# Patient Record
Sex: Female | Born: 1975
Health system: Southern US, Community
[De-identification: ages and names within clinical notes are randomized; demographics above are authoritative.]

## PROBLEM LIST (undated history)

## (undated) DIAGNOSIS — Z9289 Personal history of other medical treatment: Secondary | ICD-10-CM

## (undated) HISTORY — DX: Personal history of other medical treatment: Z92.89

---

## 2008-05-07 ENCOUNTER — Observation Stay: Payer: Self-pay

## 2008-05-09 ENCOUNTER — Inpatient Hospital Stay: Payer: Self-pay | Admitting: Obstetrics and Gynecology

## 2009-12-09 ENCOUNTER — Inpatient Hospital Stay: Payer: Self-pay | Admitting: Unknown Physician Specialty

## 2013-06-27 ENCOUNTER — Inpatient Hospital Stay: Payer: Self-pay | Admitting: Obstetrics & Gynecology

## 2013-06-27 LAB — CBC WITH DIFFERENTIAL/PLATELET
Basophil #: 0 10*3/uL (ref 0.0–0.1)
Basophil %: 0.3 %
Eosinophil %: 0.4 %
Lymphocyte #: 1.5 10*3/uL (ref 1.0–3.6)
MCH: 31.2 pg (ref 26.0–34.0)
MCHC: 34.7 g/dL (ref 32.0–36.0)
MCV: 90 fL (ref 80–100)
Monocyte #: 0.4 x10 3/mm (ref 0.2–0.9)
Monocyte %: 5 %
Neutrophil #: 7 10*3/uL — ABNORMAL HIGH (ref 1.4–6.5)
RBC: 4.31 10*6/uL (ref 3.80–5.20)
RDW: 13 % (ref 11.5–14.5)
WBC: 9 10*3/uL (ref 3.6–11.0)

## 2013-12-22 DIAGNOSIS — Z9289 Personal history of other medical treatment: Secondary | ICD-10-CM

## 2013-12-22 HISTORY — DX: Personal history of other medical treatment: Z92.89

## 2014-12-14 NOTE — Op Note (Signed)
PATIENT NAME:  Penny Lowery, Penny Lowery MR#:  [id number] DATE OF BIRTH:  02/29/1976  DATE OF PROCEDURE:  06/27/2013  PREOPERATIVE DIAGNOSES:  Prior history of cesarean section, term intrauterine pregnancy, desire for permanent sterility.   POSTOPERATIVE DIAGNOSES:  Prior history of cesarean section, term intrauterine pregnancy, desire for permanent sterility.   PROCEDURE PERFORMED:  Low transverse cesarean section, bilateral tubal ligation.  Placement of On-Q pain pump.   SURGEON: Annamarie MajorPaul Jahzion Brogden, M.D.   ASSISTANT: Anna GenreLindsay Finch, PA, student.   ANESTHESIA: Spinal.   BLOOD LOSS: 250 mL.   COMPLICATIONS: None.   FINDINGS: Normal tubes, ovaries and uterus. Viable female infant weighing 7 pounds 9 ounces. Apgar scores of 8 and 9 at one and five minutes respectively.   DISPOSITION: To the recovery room in stable condition.   TECHNIQUE: The patient is prepped and draped in the usual sterile fashion after adequate anesthesia is obtained in the supine position on the Operating Room table. Scalpel was used to create a low transverse skin incision through the area of the prior scar down the level of the rectus fascia which was dissected bilaterally using Mayo scissors. Rectus muscle was separated within the rectus fascia and separated in the midline. Peritoneum is penetrated. The bladder is inferiorly dissected and retracted. A scalpel was used to create a low transverse hysterotomy incision. The hysterotomy incision then is extended by blunt dissection. Amniotomy reveals clear fluid. The infant's head is grasped and delivered with suctioning of the oropharynx. No nuchal cord is encountered. No vacuum is used.  The remaining portion of the infant is then delivered and handed off to the pediatric team.   Cord blood is obtained and the placenta is manually extracted. The uterus is externalized and cleansed of all membranes and debris using a moist sponge. The hysterotomy incision is closed with a running #1 Vicryl  suture in a locking fashion followed by a second layer to imbricate the first layer with excellent hemostasis noted.   The right and left fallopian tubes were grasped in the midportion with a Babcock clamp and brought out to the fimbria. The midportion of this fallopian tube was then suture ligated, excised and cauterized. Excellent hemostasis is noted at the tubal site and it is sent to pathology for further review.   The uterus is placed back in the intra-abdominal cavity and the paracolic gutters are irrigated with warm saline. Re-examination of the incision reveals excellent hemostasis. The peritoneum is closed. The trocar for the On-Q pain pump is then placed through the abdomen into the subfascial space and the Silver soaker catheters are then threaded into place without difficulty. The rectus fascia is closed with 0 Maxon suture with careful placement of the suture not to incorporate the catheters. Subcutaneous tissues are irrigated and hemostasis is assured using electrocautery. Skin is closed with 4-0 Vicryl suture in a subcuticular fashion followed for placement of Dermabond. The On-Q pain pump catheters are also stabilized with Dermabond, Steri-Strips and a Tegaderm bandage. The patient goes to the Recovery Room in stable condition. All sponge, instrument, and needle counts are correct. The On-Q pain pump was flushed with 5 mL each of bupivacaine and then hooked to pain pump itself.    ____________________________ R. Annamarie MajorPaul Emani Morad, MD rph:cs D: 06/27/2013 18:25:34 ET T: 06/27/2013 19:07:02 ET JOB#: [id number]  cc: Dierdre Searles. Paul Neli Fofana, MD, <Dictator> Nadara MustardOBERT P Hawthorne Day MD ELECTRONICALLY SIGNED 06/28/2013 0:23

## 2015-01-01 NOTE — H&P (Signed)
L&D Evaluation:  History Expanded:  HPI 39 yo G3P2 at 4038 5/[redacted] weeks EGA w prior Cesarean Section, presents w pain and d/c today.  No ROM.  Good FM.  [facility], desires repeat Cesarean Section and Bilateral Tubal Ligation for permanent sterility as noted in chart throughout pregnancy and reiterated today.   O+, RI, VI. GBS Neg.   Gravida 3   Term 2   PreTerm 0   Abortion 0   Living 2   Blood Type (Maternal) O positive   Group B Strep Results Maternal (Result >5wks must be treated as unknown) negative   Maternal Varicella Immune   Rubella Results (Maternal) immune   EDC 06-Jul-2013   Presents with contractions   Patient's Medical History No Chronic Illness   Patient's Surgical History Previous C-Section   Medications Pre Natal Vitamins   Allergies PCN   Social History none   Family History Non-Contributory   ROS:  ROS All systems were reviewed.  HEENT, CNS, GI, GU, Respiratory, CV, Renal and Musculoskeletal systems were found to be normal.   Exam:  Vital Signs stable   General no apparent distress   Mental Status clear   Chest clear   Heart normal sinus rhythm, no murmur/gallop/rubs   Abdomen gravid, tender with contractions   Estimated Fetal Weight Average for gestational age   Fetal Position VTX   Back no CVAT   Edema no edema   Pelvic no external lesions, 2/80/-2   Mebranes Intact   FHT normal rate with no decels   Ucx regular   Skin dry   Impression:  Impression early labor   Plan:  Plan EFM/NST, monitor contractions and for cervical change   Comments Since cervical change here and reg contractions, will proceed w Cesarean Section and Bilateral Tubal Ligation today. Pt has been fully informed of the pros and cons, risk/benefits of repeat Cesarean section versus VBAC.  The risks of a repeat Cesarean section include all the risks of major surgery, such as the risk of anesthesia, hemorrhage, infection,  injury to adjacent organs-bowel, bladder and blood vessels.  The risks of attempting a VBAC include a 1 in 100 risk of uterine rupture with resultant potentially severe fetal/maternal complications and an increased risk of Cesarean complications should she not be successful. After carefully weighing the pros and cons, the patient strongly requests a repeat Cesarean section. All methods of contraception, both temporary and permanent, have been discussed with this patient.  She understands that tubal ligation is meant to be permanent and absolute although there is a one in 300 failure rate. The patient was informed about both the short and long term potential complications of tubal ligation.  She understands the risks of this surgery, including but not limited to, risks of anesthesia, hemorrhage, infection, injury to adjacent structures, bowel, bladder, and blood vessels.   Electronic Signatures: Letitia LibraHarris, Surah Pelley Paul (MD)  (Signed [phone] [id number]-Nov-14 17:07)  Authored: L&D Evaluation   Last Updated: 04-Nov-14 17:07 by Letitia LibraHarris, Abdalla Naramore Paul (MD)

## 2015-06-07 ENCOUNTER — Other Ambulatory Visit: Payer: Self-pay | Admitting: Otolaryngology

## 2015-06-07 DIAGNOSIS — J3489 Other specified disorders of nose and nasal sinuses: Secondary | ICD-10-CM

## 2017-04-14 ENCOUNTER — Encounter: Payer: Self-pay | Admitting: Obstetrics and Gynecology

## 2017-04-14 ENCOUNTER — Other Ambulatory Visit (INDEPENDENT_AMBULATORY_CARE_PROVIDER_SITE_OTHER): Payer: 59

## 2017-04-14 ENCOUNTER — Ambulatory Visit (INDEPENDENT_AMBULATORY_CARE_PROVIDER_SITE_OTHER): Payer: 59 | Admitting: Obstetrics and Gynecology

## 2017-04-14 VITALS — BP 100/74 | HR 88 | Ht 60.0 in | Wt 101.0 lb

## 2017-04-14 DIAGNOSIS — R1032 Left lower quadrant pain: Secondary | ICD-10-CM | POA: Diagnosis not present

## 2017-04-14 DIAGNOSIS — Z1231 Encounter for screening mammogram for malignant neoplasm of breast: Secondary | ICD-10-CM | POA: Diagnosis not present

## 2017-04-14 DIAGNOSIS — Z01419 Encounter for gynecological examination (general) (routine) without abnormal findings: Secondary | ICD-10-CM

## 2017-04-14 DIAGNOSIS — Z1239 Encounter for other screening for malignant neoplasm of breast: Secondary | ICD-10-CM

## 2017-04-14 DIAGNOSIS — Z124 Encounter for screening for malignant neoplasm of cervix: Secondary | ICD-10-CM | POA: Diagnosis not present

## 2017-04-14 DIAGNOSIS — Z1151 Encounter for screening for human papillomavirus (HPV): Secondary | ICD-10-CM

## 2017-04-14 NOTE — Progress Notes (Signed)
Chief Complaint  Patient presents with  . Gynecologic Exam  . Urinary Tract Infection    frequent but no current sx     HPI:      Ms. Penny Lowery is a 41 y.o. [phone] who LMP was Patient's last menstrual period was 04/01/2017., presents today for her annual examination.  Her menses are regular every 28-30 days, lasting 5 days.  Dysmenorrhea none. She does not have intermenstrual bleeding.  Sex activity: single partner, contraception - tubal ligation.  Last Pap: Dec 22, 2013  Results were: no abnormalities /neg HPV DNA  Hx of STDs: none  There is no FH of breast cancer. There is no FH of ovarian cancer. The patient does do self-breast exams.  Tobacco use: The patient denies current or previous tobacco use. Alcohol use: none Exercise: moderately active  She does get adequate calcium and Vitamin D in her diet.  She complains of intermittent LLQ pain over the past few months. No relation to her cycle.  She denies any GI sx, vag sx, fevers. She has occas dysuria but has neg UTI eval when she goes.    Past Medical History:  Diagnosis Date  . History of Papanicolaou smear of cervix 12/22/2013   NIL/NEG    Past Surgical History:  Procedure Laterality Date  . CESAREAN SECTION      Family History  Problem Relation Age of Onset  . Diabetes Mother   . Hypertension Mother   . Stroke Father 69  . Breast cancer Neg Hx   . Ovarian cancer Neg Hx     Social History   Social History  . Marital status: Married    Spouse name: N/A  . Number of children: N/A  . Years of education: N/A   Occupational History  . Not on file.   Social History Main Topics  . Smoking status: Never Smoker  . Smokeless tobacco: Never Used  . Alcohol use Yes     Comment: occasional  . Drug use: No  . Sexual activity: Yes   Other Topics Concern  . Not on file   Social History Narrative  . No narrative on file    No current outpatient prescriptions on file.  ROS:  Review of Systems    Constitutional: Negative for fatigue, fever and unexpected weight change.  Respiratory: Negative for cough, shortness of breath and wheezing.   Cardiovascular: Negative for chest pain, palpitations and leg swelling.  Gastrointestinal: Negative for blood in stool, constipation, diarrhea, nausea and vomiting.  Endocrine: Negative for cold intolerance, heat intolerance and polyuria.  Genitourinary: Positive for dysuria and pelvic pain. Negative for dyspareunia, flank pain, frequency, genital sores, hematuria, menstrual problem, urgency, vaginal bleeding, vaginal discharge and vaginal pain.  Musculoskeletal: Negative for back pain, joint swelling and myalgias.  Skin: Negative for rash.  Neurological: Positive for headaches. Negative for dizziness, syncope, light-headedness and numbness.  Hematological: Negative for adenopathy.  Psychiatric/Behavioral: Negative for agitation, confusion, sleep disturbance and suicidal ideas. The patient is not nervous/anxious.      Objective: BP 100/74   Pulse 88   Ht 5' (1.524 m)   Wt 101 lb (45.8 kg)   LMP 04/01/2017   BMI 19.73 kg/m    Physical Exam  Constitutional: She is oriented to person, place, and time. She appears well-developed and well-nourished.  Genitourinary: Vagina normal and uterus normal. There is no rash or tenderness on the right labia. There is no rash or tenderness on the left labia. No erythema  or tenderness in the vagina. No vaginal discharge found. Right adnexum does not display mass and does not display tenderness.  Left adnexum displays tenderness. Left adnexum does not display mass and does not display fullness. Cervix does not exhibit motion tenderness or polyp. Uterus is not enlarged or tender.  Neck: Normal range of motion. No thyromegaly present.  Cardiovascular: Normal rate, regular rhythm and normal heart sounds.   No murmur heard. Pulmonary/Chest: Effort normal and breath sounds normal. Right breast exhibits no mass, no  nipple discharge, no skin change and no tenderness. Left breast exhibits no mass, no nipple discharge, no skin change and no tenderness.  Abdominal: Soft. There is tenderness in the left lower quadrant. There is no rigidity and no guarding.  Musculoskeletal: Normal range of motion.  Neurological: She is alert and oriented to person, place, and time. No cranial nerve deficit.  Psychiatric: She has a normal mood and affect. Her behavior is normal.  Vitals reviewed.   Results: GYN U/S-->EM=6.77 MM; NO FF IN CDS; BILAT OVAR WNL   Assessment/Plan: Encounter for annual routine gynecological examination  Cervical cancer screening - Plan: IGP, Aptima HPV  Screening for HPV (human papillomavirus) - Plan: IGP, Aptima HPV  Screening for breast cancer - Pt to sched mammo. - Plan: MM DIGITAL SCREENING BILATERAL  LLQ pain - Tender on exam, sx intermittent, neg GYN u/s. Can f/u with PCP if sx persist.  - Plan: US Transvaginal Non-OB            GYN counsel mammography screening, adequate intake of calcium and vitamin D, diet and exercise     F/U  Return in about 1 year (around 04/14/2018).  Opal Dinning B. Nohealani Medinger, PA-C 04/14/2017 4:31 PM

## 2017-04-14 NOTE — Patient Instructions (Signed)
[facility]: [phone]  [facility]: [phone] 

## 2017-04-17 LAB — IGP, APTIMA HPV
HPV APTIMA: NEGATIVE
PAP SMEAR COMMENT: 0

## 2017-09-15 ENCOUNTER — Encounter: Payer: Self-pay | Admitting: Obstetrics and Gynecology

## 2017-09-15 ENCOUNTER — Ambulatory Visit
Admission: RE | Admit: 2017-09-15 | Discharge: 2017-09-15 | Disposition: A | Discharge status: Home / Self Care | Payer: 59 | Source: Ambulatory Visit | Attending: Obstetrics and Gynecology | Admitting: Obstetrics and Gynecology

## 2017-09-15 DIAGNOSIS — Z1231 Encounter for screening mammogram for malignant neoplasm of breast: Secondary | ICD-10-CM | POA: Insufficient documentation

## 2017-09-15 DIAGNOSIS — Z1239 Encounter for other screening for malignant neoplasm of breast: Secondary | ICD-10-CM

## 2018-09-27 ENCOUNTER — Ambulatory Visit (INDEPENDENT_AMBULATORY_CARE_PROVIDER_SITE_OTHER): Payer: 59 | Admitting: Urology

## 2018-09-27 ENCOUNTER — Encounter: Payer: Self-pay | Admitting: Urology

## 2018-09-27 VITALS — BP 137/81 | HR 75 | Ht 60.0 in | Wt 109.7 lb

## 2018-09-27 DIAGNOSIS — R1012 Left upper quadrant pain: Secondary | ICD-10-CM

## 2018-09-27 DIAGNOSIS — N200 Calculus of kidney: Secondary | ICD-10-CM

## 2018-09-27 DIAGNOSIS — N390 Urinary tract infection, site not specified: Secondary | ICD-10-CM

## 2018-09-27 LAB — MICROSCOPIC EXAMINATION
Bacteria, UA: NONE SEEN
RBC, UA: NONE SEEN /hpf (ref 0–2)
WBC, UA: NONE SEEN /hpf (ref 0–5)

## 2018-09-27 LAB — URINALYSIS, COMPLETE
Bilirubin, UA: NEGATIVE
Glucose, UA: NEGATIVE
Ketones, UA: NEGATIVE
Leukocytes, UA: NEGATIVE
NITRITE UA: NEGATIVE
Protein, UA: NEGATIVE
Specific Gravity, UA: 1.015 (ref 1.005–1.030)
UUROB: 0.2 mg/dL (ref 0.2–1.0)
pH, UA: 7 (ref 5.0–7.5)

## 2018-09-27 NOTE — Progress Notes (Signed)
09/27/2018 12:16 PM   Artemio Aly 1976/05/28 [id number]  Referring provider: Dione Housekeeper, MD [address] [place], [place] [address]  CC: Recurrent UTI  HPI: I saw Ms. almost in urology clinic in consultation for recurrent UTI from Dr. Zada Finders.  She is a healthy 43 year old Spanish-speaking female who reports 3-4 UTIs per year.  This is been going on over 2 to 3 years.  When she has a UTI she reports urinary frequency, dysuria, and pelvic pain.  There are no prior positive urine cultures in the chart that I am able to review.  She reports that her symptoms improved with antibiotics.  She has a history of a single spontaneously passed kidney stone approximately 15 years ago, and has never required any urologic surgery.  In between urinary tract infection she denies any symptoms of hematuria, dysuria, frequency, urgency, or leakage.  There are no aggravating or alleviating factors.  Severity is moderate.  She also has intermittent left-sided low back pain over the last 5 months.  There is no imaging to review.   PMH: Past Medical History:  Diagnosis Date  . History of Papanicolaou smear of cervix 12/22/2013   NIL/NEG    Surgical History: Past Surgical History:  Procedure Laterality Date  . CESAREAN SECTION      Allergies:  Allergies  Allergen Reactions  . Penicillin G Rash    Family History: Family History  Problem Relation Age of Onset  . Diabetes Mother   . Hypertension Mother   . Stroke Father 56  . Breast cancer Neg Hx   . Ovarian cancer Neg Hx     Social History:  reports that she has never smoked. She has never used smokeless tobacco. She reports current alcohol use. She reports that she does not use drugs.  ROS: Please see flowsheet from today's date for complete review of systems.  Physical Exam: BP 137/81 (BP Location: Left Arm, Patient Position: Sitting, Cuff Size: Normal)   Pulse 75   Ht 5' (1.524 m)   Wt 109 lb 11.2 oz (49.8 kg)   BMI 21.42  kg/m    Constitutional:  Alert and oriented, No acute distress. Cardiovascular: No clubbing, cyanosis, or edema. Respiratory: Normal respiratory effort, no increased work of breathing. GI: Abdomen is soft, nontender, nondistended, no abdominal masses GU: No CVA tenderness Lymph: No cervical or inguinal lymphadenopathy. Skin: No rashes, bruises or suspicious lesions. Neurologic: Grossly intact, no focal deficits, moving all 4 extremities. Psychiatric: Normal mood and affect.  Laboratory Data: Urinalysis today 0 WBCs, 0 RBCs, no bacteria, nitrite negative  Pertinent Imaging: None to review  Assessment & Plan:   In summary the patient is a healthy 43 year old female with reported history of recurrent urinary tract infections and distant history of spontaneously passed kidney stone.  She has chronic left-sided low flank pain for around 5 months that is not classic for stone pain.  Urinalysis is negative today.  We discussed the evaluation and treatment of patients with recurrent UTIs at length.  We specifically discussed the differences between asymptomatic bacteriuria and true urinary tract infection.  We discussed the AUA definition of recurrent UTI of at least 2 culture proven symptomatic acute cystitis episodes in a 49-month period, or 3 within a 1 year period.  We discussed the importance of culture directed antibiotic treatment, and antibiotic stewardship.  First-line therapy includes nitrofurantoin(5 days), Bactrim(3 days), or fosfomycin(3 g single dose).  Possible etiologies of recurrent infection include periurethral tissue atrophy in postmenopausal  woman, constipation, sexual activity, incomplete emptying, anatomic abnormalities, and even genetic predisposition.  Finally, we discussed the role of perineal hygiene, timed voiding, adequate hydration, topical vaginal estrogen, cranberry prophylaxis, and low-dose antibiotic prophylaxis.  Cranberry prophylaxis for UTIs CT stone protocol to  evaluate for nephrolithiasis RTC 6 months for symptom check  Sondra Come, MD  [facility] [address] [place], [place] [address] [phone]

## 2018-09-27 NOTE — Patient Instructions (Signed)
Infeccin urinaria en los adultos  Urinary Tract Infection, Adult    Una infeccin urinaria (IU) puede ocurrir en cualquier lugar de las vas urinarias. Las vas urinarias incluyen a los riones, los urteres, la vejiga y la uretra. Estos rganos fabrican, almacenan y eliminan la orina del organismo.  El mdico puede usar otras palabras para describir la infeccin. La IU alta afecta los urteres y a los riones (pielonefritis). La IU baja afecta la vejiga (cistitis) y la uretra (uretritis).  Cules son las causas?  La mayora de las infecciones de las vas urinarias es causada por la presencia de bacterias en la zona genital, alrededor de la entrada de las vas urinarias (uretra). Estas bacterias proliferan y causan inflamacin en las vas urinarias.  Qu incrementa el riesgo?  Es ms probable que contraiga esta afeccin si:   Tiene colocado un catter urinario (sonda urinaria) permanente.   No puede controlar cundo orinar o defecar (tiene incontinencia).   Es mujer y usted:  ? Utiliza espermicida o diafragma como mtodo anticonceptivo.  ? Tiene niveles bajos de estrgenos.  ? Est embarazada.   Tiene ciertos genes que aumentan su riesgo (gentica).   Es sexualmente activa.   Toma antibiticos.   Tiene una afeccin que causa que el flujo de orina sea lento, como:  ? Prstata agrandada, si usted es hombre.  ? Obstruccin de la uretra (estenosis).  ? Clculo renal.  ? Una afeccin nerviosa que afecta el control de la vejiga (vejiga neurgena).  ? No bebe lo suficiente o no orina con frecuencia.   Tiene ciertas enfermedades crnicas, como:  ? Diabetes.  ? Un sistema que combate las enfermedades (sistemainmunitario) debilitado.  ? Anemia drepanoctica.  ? Gota.  ? Lesin en la mdula espinal.  Cules son los signos o los sntomas?  Los sntomas de esta afeccin incluyen:   Necesidad inmediata (urgente) de orinar.   Miccin frecuente o eliminacin de pequeas cantidades de orina con frecuencia.   Ardor o  dolor al orinar.   Presencia de sangre en la orina.   Orina con mal olor u olor atpico.   Dificultad para orinar.   Orina turbia.   Secrecin vaginal, si es mujer.   Dolor en el abdomen o en la parte inferior de la espalda.  Es posible que tambin tenga:   Vmitos o disminucin del apetito.   Confusin.   Irritabilidad o cansancio.   Fiebre.   Diarrea.  El primer sntoma en los adultos mayores puede ser la confusin. En algunos casos, es posible que no tengan sntomas hasta que la infeccin empeore.  Cmo se diagnostica?  Esta afeccin se diagnostica en funcin de sus antecedentes mdicos y de un examen fsico. Tambin pueden hacerle otras pruebas, incluidas las siguientes:   Anlisis de orina.   Anlisis de sangre.   Pruebas de infecciones de transmisin sexual (ITS).  Si ha tenido ms de una infeccin urinaria (IU), se pueden hacer estudios de diagnstico por imgenes o una cistoscopia para determinar la causa de las infecciones.  Cmo se trata?  El tratamiento de esta afeccin incluye lo siguiente:   Antibiticos.   Medicamentos de venta libre para aliviar las molestias.   Beber una cantidad suficiente agua para mantenerse hidratado.  Si tiene infecciones con frecuencia o tiene otras afecciones, como un clculo renal, es posible que deba ver a un mdico especialista en las vas urinarias (urlogo).  En casos poco frecuentes, las infecciones urinarias pueden provocar sepsis. La sepsis es una afeccin potencialmente   mortal que se produce cuando el cuerpo responde a una infeccin. La sepsis se trata en el hospital con antibiticos, lquidos y otros medicamentos que se administran por va intravenosa.  Siga estas indicaciones en su casa:    Medicamentos   Tome los medicamentos de venta libre y los recetados solamente como se lo haya indicado el mdico.   Si le recetaron un antibitico, tmelo como se lo haya indicado el mdico. No deje de usar el antibitico aunque comience a sentirse  mejor.  Indicaciones generales   Asegrese de hacer lo siguiente:  ? Vaciar la vejiga con frecuencia y en su totalidad. No contener la orina durante largos perodos.  ? Vaciar la vejiga despus de tener sexo.  ? Limpiarse de adelante hacia atrs despus de defecar, si es mujer. Use cada trozo de papel higinico solo una vez cuando se limpie.   Beba suficiente lquido como para mantener la orina de color amarillo plido.   Concurra a todas las visitas de control como se lo haya indicado el mdico. Esto es importante.  Comunquese con un mdico si:   Los sntomas no mejoran despus de 1 o 2das de tratamiento.   Los sntomas desaparecen y luego vuelven a aparecer.  Solicite ayuda inmediatamente si tiene:   Dolor intenso en la espalda o en la parte inferior del abdomen.   Fiebre.   Nuseas o vmitos.  Resumen   Una infeccin urinaria (IU) es una infeccin en cualquier parte de las vas urinarias, que incluyen los riones, los urteres, la vejiga y la uretra.   La mayora de las infecciones de las vas urinarias es causada por la presencia de bacterias en la zona genital, alrededor de la entrada de las vas urinarias (uretra).   El tratamiento de esta afeccin suele incluir antibiticos.   Si le recetaron un antibitico, tmelo como se lo haya indicado el mdico. No deje de usar el antibitico aunque comience a sentirse mejor.   Concurra a todas las visitas de control como se lo haya indicado el mdico. Esto es importante.  Esta informacin no tiene como fin reemplazar el consejo del mdico. Asegrese de hacerle al mdico cualquier pregunta que tenga.  Document Released: 05/20/2005 Document Revised: 04/13/2018 Document Reviewed: 04/13/2018  Elsevier Interactive Patient Education  2019 [employer]

## 2018-10-07 ENCOUNTER — Ambulatory Visit
Admission: RE | Admit: 2018-10-07 | Discharge: 2018-10-07 | Disposition: A | Discharge status: Home / Self Care | Payer: 59 | Source: Ambulatory Visit | Attending: Urology | Admitting: Urology

## 2018-10-07 DIAGNOSIS — R1012 Left upper quadrant pain: Secondary | ICD-10-CM

## 2019-01-14 IMAGING — MG MM DIGITAL SCREENING BILAT W/ CAD
5 series · 5 of 5 positions shown · non-contrast
Comparison: None.

CLINICAL DATA: Screening.

EXAM:
DIGITAL SCREENING BILATERAL MAMMOGRAM WITH CAD

[R CC]
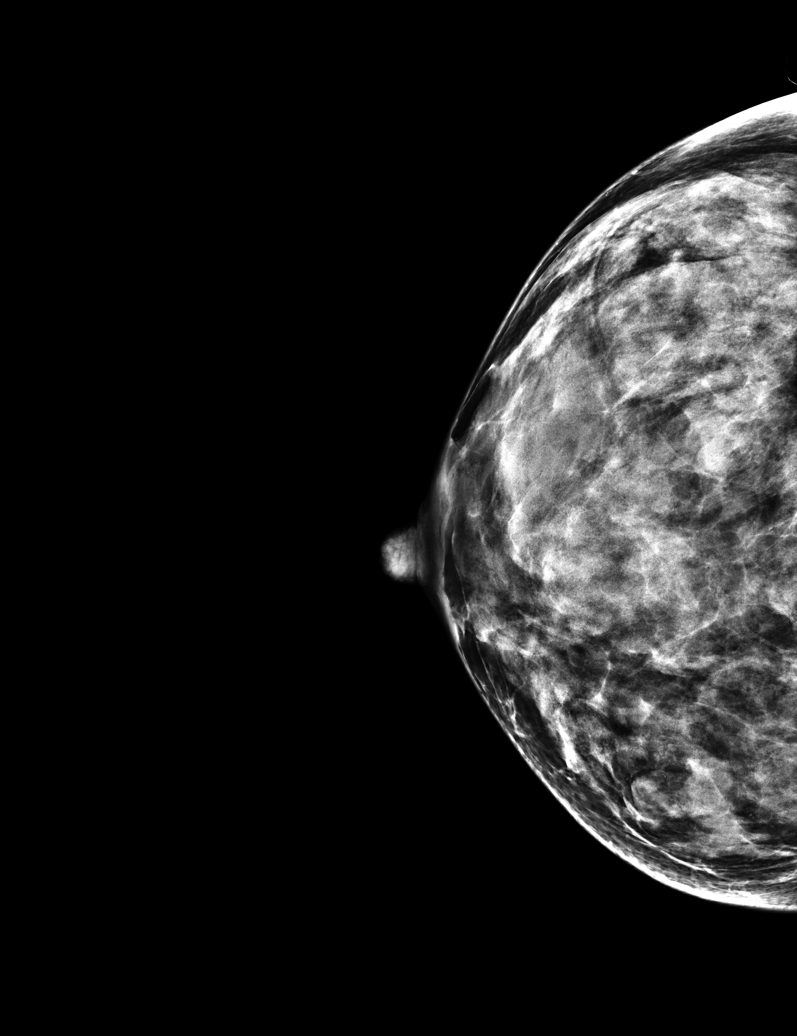

[R MLO (1 of 2)]
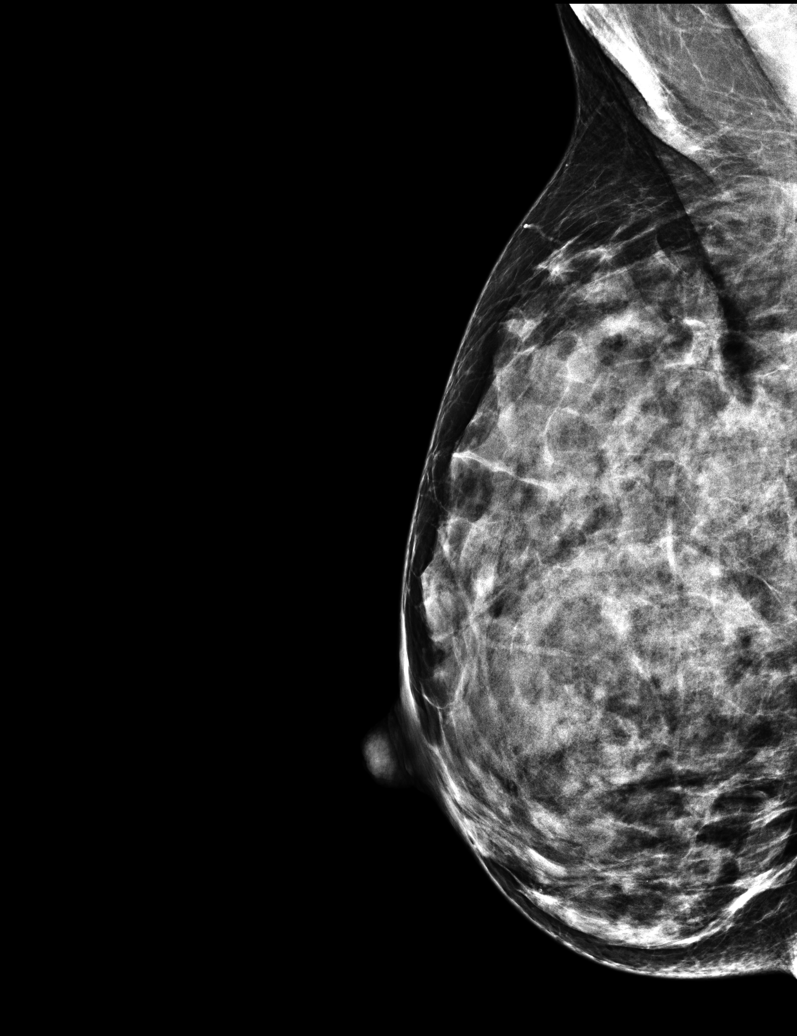

[R MLO (2 of 2)]
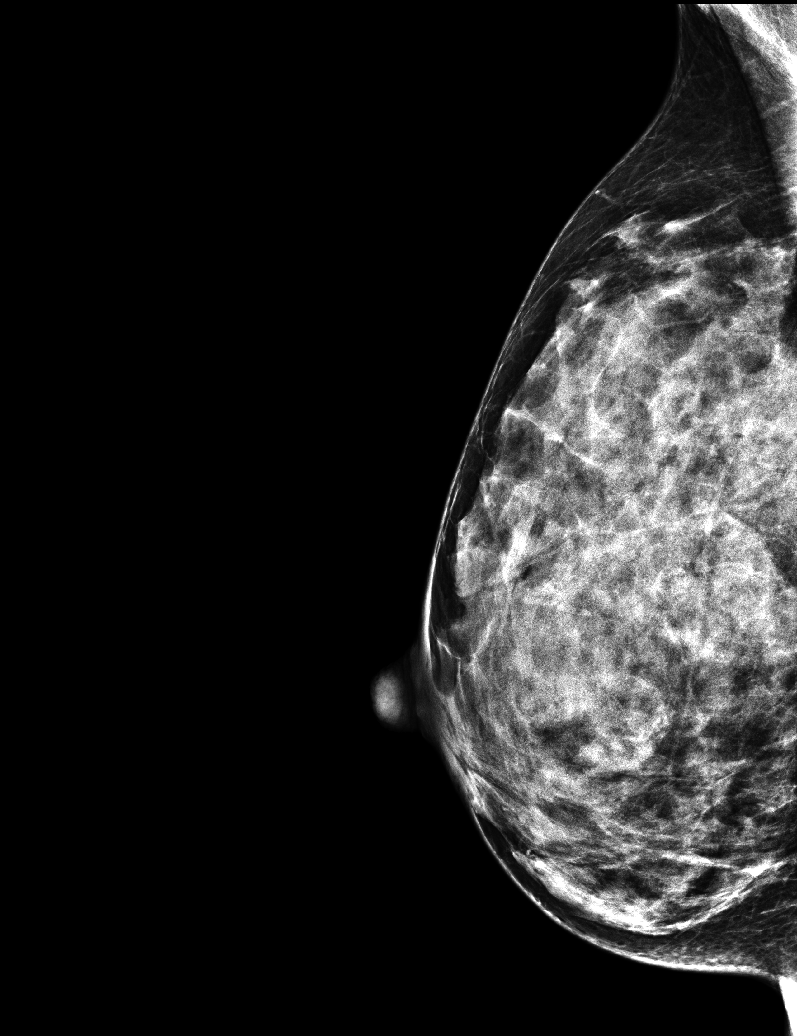

[L MLO]
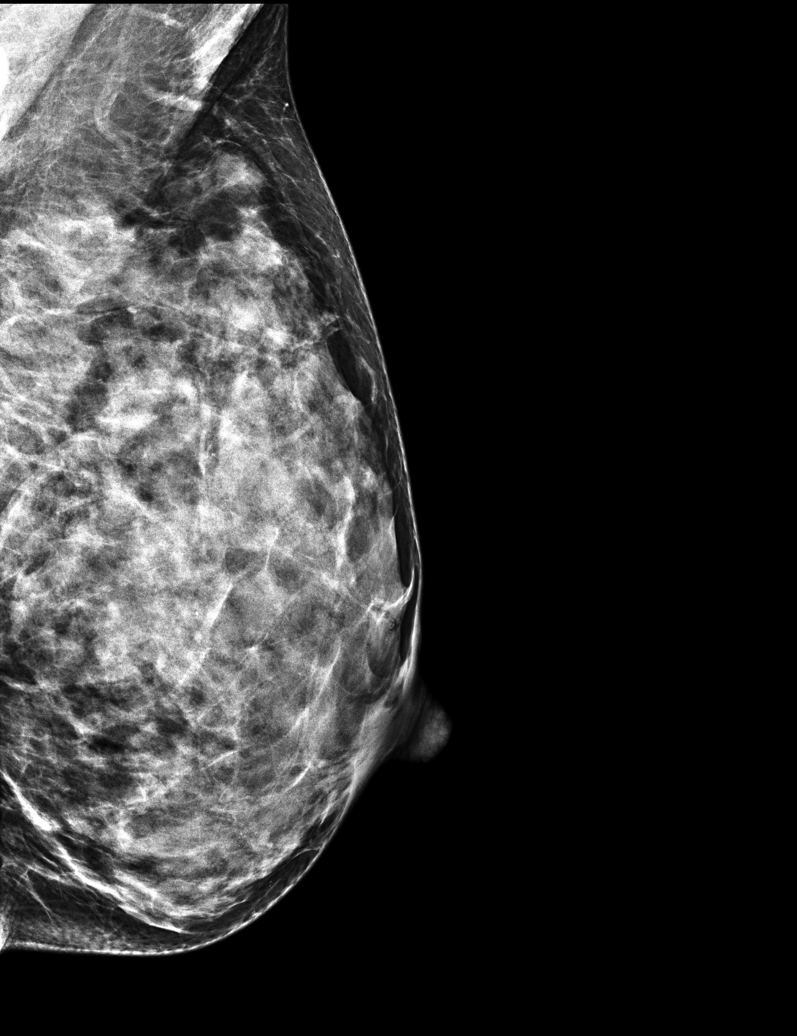

[L CC]
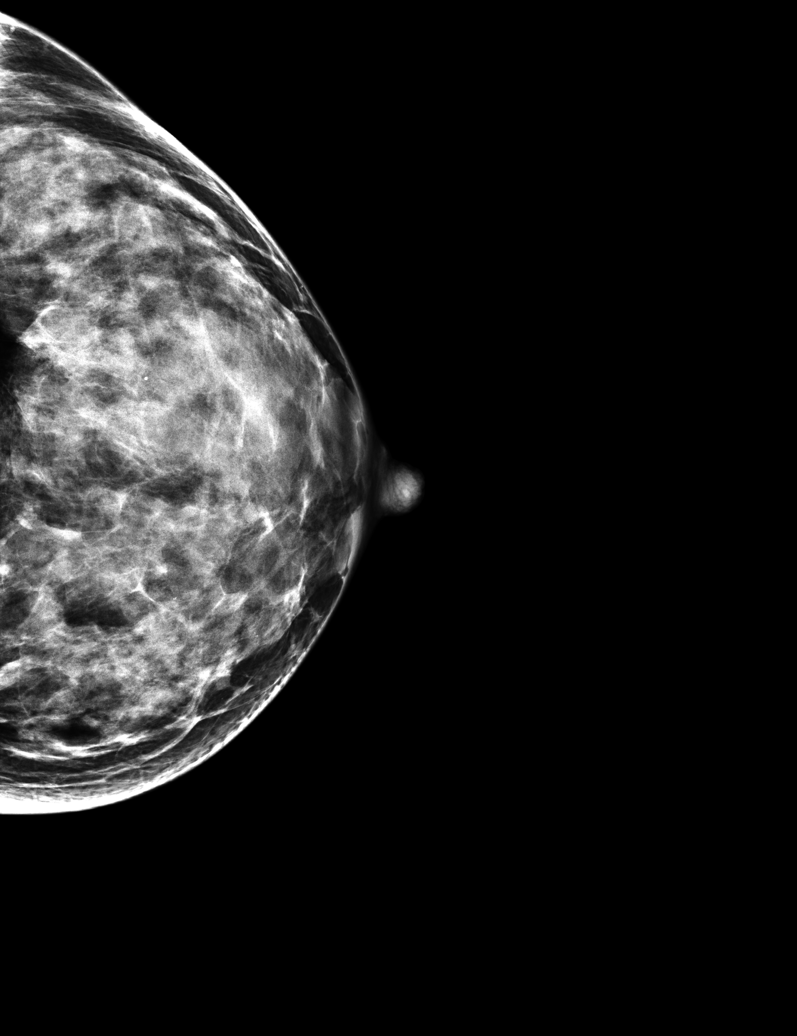

[5 of 5 positions shown; findings below may reference images not displayed]

ACR Breast Density Category d: The breast tissue is extremely dense,
which lowers the sensitivity of mammography.
FINDINGS: There are no findings suspicious for malignancy. Images were
processed with CAD.
IMPRESSION: No mammographic evidence of malignancy. A result letter of this
screening mammogram will be mailed directly to the patient.

RECOMMENDATION:
Screening mammogram in one year. (Code:8A-L-C49)

BI-RADS CATEGORY  1: Negative.

## 2019-03-09 ENCOUNTER — Other Ambulatory Visit: Payer: Self-pay | Admitting: Urology

## 2019-03-09 DIAGNOSIS — N200 Calculus of kidney: Secondary | ICD-10-CM

## 2019-03-09 NOTE — Progress Notes (Signed)
kub

## 2019-03-28 ENCOUNTER — Ambulatory Visit: Payer: 59 | Admitting: Urology

## 2019-04-06 ENCOUNTER — Ambulatory Visit: Payer: 59 | Admitting: Urology

## 2019-11-06 DIAGNOSIS — Z23 Encounter for immunization: Secondary | ICD-10-CM | POA: Diagnosis not present

## 2019-12-11 DIAGNOSIS — Z23 Encounter for immunization: Secondary | ICD-10-CM | POA: Diagnosis not present

## 2020-02-08 ENCOUNTER — Encounter: Payer: Self-pay | Admitting: Obstetrics and Gynecology

## 2020-02-08 ENCOUNTER — Other Ambulatory Visit: Payer: Self-pay

## 2020-02-08 ENCOUNTER — Ambulatory Visit (INDEPENDENT_AMBULATORY_CARE_PROVIDER_SITE_OTHER): Payer: BC Managed Care – PPO | Admitting: Obstetrics and Gynecology

## 2020-02-08 VITALS — BP 104/80 | Ht 63.0 in | Wt 106.0 lb

## 2020-02-08 DIAGNOSIS — Z1231 Encounter for screening mammogram for malignant neoplasm of breast: Secondary | ICD-10-CM

## 2020-02-08 DIAGNOSIS — Z01419 Encounter for gynecological examination (general) (routine) without abnormal findings: Secondary | ICD-10-CM | POA: Diagnosis not present

## 2020-02-08 NOTE — Progress Notes (Signed)
Chief Complaint  Patient presents with  . Gynecologic Exam     HPI:      Ms. Penny Lowery is a 44 y.o. [phone] who LMP was Patient's last menstrual period was 02/03/2020 (exact date)., presents today for her annual examination.  Her menses are regular every 28-30 days, lasting 5 days.  Dysmenorrhea none. She does not have intermenstrual bleeding.  Sex activity: single partner, contraception - tubal ligation.  Last Pap: 04/14/17  Results were: no abnormalities /neg HPV DNA  Hx of STDs: none  Last mammo: 09/15/17 Results: normal, repeat in 12 months. There is no FH of breast cancer. There is no FH of ovarian cancer. The patient does not do self-breast exams.  Tobacco use: The patient denies current or previous tobacco use. Alcohol use: none  No drug use Exercise: moderately active  She does get adequate calcium but not Vitamin D in her diet.  LLQ pain from 2018 resolved.    Past Medical History:  Diagnosis Date  . History of Papanicolaou smear of cervix 12/22/2013   NIL/NEG    Past Surgical History:  Procedure Laterality Date  . CESAREAN SECTION      Family History  Problem Relation Age of Onset  . Diabetes Mother   . Hypertension Mother   . Stroke Father 61  . Lung cancer Maternal Uncle   . Breast cancer Neg Hx   . Ovarian cancer Neg Hx     Social History   Socioeconomic History  . Marital status: Married    Spouse name: Not on file  . Number of children: Not on file  . Years of education: Not on file  . Highest education level: Not on file  Occupational History  . Not on file  Tobacco Use  . Smoking status: Never Smoker  . Smokeless tobacco: Never Used  Vaping Use  . Vaping Use: Never used  Substance and Sexual Activity  . Alcohol use: Yes    Comment: occasional  . Drug use: No  . Sexual activity: Yes    Birth control/protection: Surgical    Comment: Tubal Ligation  Other Topics Concern  . Not on file  Social History Narrative  . Not on file    Social Determinants of Health   Financial Resource Strain:   . Difficulty of Paying Living Expenses:   Food Insecurity:   . Worried About Programme researcher, broadcasting/film/video in the Last Year:   . Barista in the Last Year:   Transportation Needs:   . Freight forwarder (Medical):   Marland Kitchen Lack of Transportation (Non-Medical):   Physical Activity:   . Days of Exercise per Week:   . Minutes of Exercise per Session:   Stress:   . Feeling of Stress :   Social Connections:   . Frequency of Communication with Friends and Family:   . Frequency of Social Gatherings with Friends and Family:   . Attends Religious Services:   . Active Member of Clubs or Organizations:   . Attends Banker Meetings:   Marland Kitchen Marital Status:   Intimate Partner Violence:   . Fear of Current or Ex-Partner:   . Emotionally Abused:   Marland Kitchen Physically Abused:   . Sexually Abused:     No current outpatient medications on file.  ROS:  Review of Systems  Constitutional: Negative for fatigue, fever and unexpected weight change.  Respiratory: Negative for cough, shortness of breath and wheezing.   Cardiovascular: Negative for chest  pain, palpitations and leg swelling.  Gastrointestinal: Negative for blood in stool, constipation, diarrhea, nausea and vomiting.  Endocrine: Negative for cold intolerance, heat intolerance and polyuria.  Genitourinary: Negative for dyspareunia, dysuria, flank pain, frequency, genital sores, hematuria, menstrual problem, pelvic pain, urgency, vaginal bleeding, vaginal discharge and vaginal pain.  Musculoskeletal: Negative for back pain, joint swelling and myalgias.  Skin: Negative for rash.  Neurological: Negative for dizziness, syncope, light-headedness, numbness and headaches.  Hematological: Negative for adenopathy.  Psychiatric/Behavioral: Negative for agitation, confusion, sleep disturbance and suicidal ideas. The patient is not nervous/anxious.      Objective: BP 104/80   Ht  5\' 3"  (1.6 m)   Wt 106 lb (48.1 kg)   LMP 02/03/2020 (Exact Date)   BMI 18.78 kg/m    Physical Exam Constitutional:      Appearance: She is well-developed.  Genitourinary:     Vagina and uterus normal.     No vaginal discharge, erythema or tenderness.     No cervical motion tenderness or polyp.     Uterus is not enlarged or tender.     No right or left adnexal mass present.     Right adnexa not tender.     Left adnexa tender.     Left adnexa not full.  Neck:     Thyroid: No thyromegaly.  Cardiovascular:     Rate and Rhythm: Normal rate and regular rhythm.     Heart sounds: Normal heart sounds. No murmur heard.   Pulmonary:     Effort: Pulmonary effort is normal.     Breath sounds: Normal breath sounds.  Chest:     Breasts:        Right: No mass, nipple discharge, skin change or tenderness.        Left: No mass, nipple discharge, skin change or tenderness.  Abdominal:     Palpations: Abdomen is soft. Abdomen is not rigid.     Tenderness: There is no abdominal tenderness. There is no guarding.  Musculoskeletal:        General: Normal range of motion.     Cervical back: Normal range of motion.  Neurological:     Mental Status: She is alert and oriented to person, place, and time.     Cranial Nerves: No cranial nerve deficit.  Psychiatric:        Behavior: Behavior normal.  Vitals reviewed.    Assessment/Plan: Encounter for annual routine gynecological examination  Encounter for screening mammogram for malignant neoplasm of breast - Plan: MM 3D SCREEN BREAST BILATERAL; pt to sched mammo            GYN counsel mammography screening, adequate intake of calcium and vitamin D, diet and exercise     F/U  Return in about 1 year (around 02/07/2021).  Giah Fickett B. Nataley Bahri, PA-C 02/08/2020 10:38 AM

## 2020-02-08 NOTE — Patient Instructions (Signed)
I value your feedback and entrusting us with your care. If you get a [facility] patient survey, I would appreciate you taking the time to let us know about your experience today. Thank you! ° °As of August 03, 2019, your lab results will be released to your MyChart immediately, before I even have a chance to see them. Please give me time to review them and contact you if there are any abnormalities. Thank you for your patience.  ° °[facility]: [phone] ° ° ° °

## 2020-03-26 ENCOUNTER — Other Ambulatory Visit: Payer: Self-pay

## 2020-03-26 ENCOUNTER — Ambulatory Visit
Admission: RE | Admit: 2020-03-26 | Discharge: 2020-03-26 | Disposition: A | Discharge status: Home / Self Care | Payer: BC Managed Care – PPO | Source: Ambulatory Visit | Attending: Obstetrics and Gynecology | Admitting: Obstetrics and Gynecology

## 2020-03-26 DIAGNOSIS — Z1231 Encounter for screening mammogram for malignant neoplasm of breast: Secondary | ICD-10-CM | POA: Insufficient documentation

## 2020-03-27 ENCOUNTER — Other Ambulatory Visit: Payer: Self-pay | Admitting: Obstetrics and Gynecology

## 2020-03-27 DIAGNOSIS — R928 Other abnormal and inconclusive findings on diagnostic imaging of breast: Secondary | ICD-10-CM

## 2020-03-27 DIAGNOSIS — R921 Mammographic calcification found on diagnostic imaging of breast: Secondary | ICD-10-CM

## 2020-04-09 ENCOUNTER — Encounter: Payer: Self-pay | Admitting: Obstetrics and Gynecology

## 2020-04-09 ENCOUNTER — Other Ambulatory Visit: Payer: Self-pay

## 2020-04-09 ENCOUNTER — Ambulatory Visit
Admission: RE | Admit: 2020-04-09 | Discharge: 2020-04-09 | Disposition: A | Discharge status: Home / Self Care | Payer: BC Managed Care – PPO | Source: Ambulatory Visit | Attending: Obstetrics and Gynecology | Admitting: Obstetrics and Gynecology

## 2020-04-09 DIAGNOSIS — R928 Other abnormal and inconclusive findings on diagnostic imaging of breast: Secondary | ICD-10-CM | POA: Insufficient documentation

## 2020-04-09 DIAGNOSIS — R921 Mammographic calcification found on diagnostic imaging of breast: Secondary | ICD-10-CM | POA: Diagnosis not present

## 2020-09-07 ENCOUNTER — Other Ambulatory Visit: Payer: Self-pay

## 2020-09-07 DIAGNOSIS — Z20822 Contact with and (suspected) exposure to covid-19: Secondary | ICD-10-CM

## 2020-09-11 LAB — NOVEL CORONAVIRUS, NAA: SARS-CoV-2, NAA: DETECTED — AB

## 2020-09-16 ENCOUNTER — Other Ambulatory Visit: Payer: Self-pay | Admitting: Obstetrics and Gynecology

## 2020-09-16 DIAGNOSIS — R921 Mammographic calcification found on diagnostic imaging of breast: Secondary | ICD-10-CM

## 2020-09-27 DIAGNOSIS — J301 Allergic rhinitis due to pollen: Secondary | ICD-10-CM | POA: Diagnosis not present

## 2020-09-27 DIAGNOSIS — J329 Chronic sinusitis, unspecified: Secondary | ICD-10-CM | POA: Diagnosis not present

## 2020-09-27 DIAGNOSIS — J342 Deviated nasal septum: Secondary | ICD-10-CM | POA: Diagnosis not present

## 2020-10-11 ENCOUNTER — Other Ambulatory Visit: Payer: Self-pay

## 2020-10-11 ENCOUNTER — Other Ambulatory Visit: Payer: Self-pay | Admitting: Obstetrics and Gynecology

## 2020-10-11 ENCOUNTER — Ambulatory Visit
Admission: RE | Admit: 2020-10-11 | Discharge: 2020-10-11 | Disposition: A | Discharge status: Home / Self Care | Payer: BC Managed Care – PPO | Source: Ambulatory Visit | Attending: Obstetrics and Gynecology | Admitting: Obstetrics and Gynecology

## 2020-10-11 DIAGNOSIS — R921 Mammographic calcification found on diagnostic imaging of breast: Secondary | ICD-10-CM | POA: Diagnosis not present

## 2020-10-11 DIAGNOSIS — N6310 Unspecified lump in the right breast, unspecified quadrant: Secondary | ICD-10-CM | POA: Insufficient documentation

## 2020-10-11 DIAGNOSIS — R922 Inconclusive mammogram: Secondary | ICD-10-CM | POA: Diagnosis not present

## 2020-10-25 DIAGNOSIS — J329 Chronic sinusitis, unspecified: Secondary | ICD-10-CM | POA: Diagnosis not present

## 2020-10-25 DIAGNOSIS — J301 Allergic rhinitis due to pollen: Secondary | ICD-10-CM | POA: Diagnosis not present

## 2020-10-25 DIAGNOSIS — J342 Deviated nasal septum: Secondary | ICD-10-CM | POA: Diagnosis not present

## 2021-03-03 ENCOUNTER — Ambulatory Visit
Admission: RE | Admit: 2021-03-03 | Discharge: 2021-03-03 | Disposition: A | Discharge status: Home / Self Care | Payer: BC Managed Care – PPO | Source: Ambulatory Visit | Attending: Family Medicine | Admitting: Family Medicine

## 2021-03-03 ENCOUNTER — Other Ambulatory Visit: Payer: Self-pay

## 2021-03-03 ENCOUNTER — Other Ambulatory Visit: Payer: Self-pay | Admitting: Family Medicine

## 2021-03-03 ENCOUNTER — Ambulatory Visit
Admission: RE | Admit: 2021-03-03 | Discharge: 2021-03-03 | Disposition: A | Discharge status: Home / Self Care | Payer: BC Managed Care – PPO | Attending: Family Medicine | Admitting: Family Medicine

## 2021-03-03 DIAGNOSIS — L089 Local infection of the skin and subcutaneous tissue, unspecified: Secondary | ICD-10-CM
